# Patient Record
Sex: Male | Born: 1980 | Race: White | Hispanic: No | Marital: Single | Smoking: Light tobacco smoker
Health system: Southern US, Community
[De-identification: ages and names within clinical notes are randomized; demographics above are authoritative.]

---

## 2005-02-13 ENCOUNTER — Encounter (INDEPENDENT_AMBULATORY_CARE_PROVIDER_SITE_OTHER): Payer: Self-pay | Admitting: Specialist

## 2005-02-13 ENCOUNTER — Ambulatory Visit (HOSPITAL_COMMUNITY): Admission: RE | Admit: 2005-02-13 | Discharge: 2005-02-13 | Payer: Self-pay | Admitting: General Surgery

## 2005-02-13 ENCOUNTER — Encounter (INDEPENDENT_AMBULATORY_CARE_PROVIDER_SITE_OTHER): Payer: Self-pay | Admitting: *Deleted

## 2005-02-19 ENCOUNTER — Ambulatory Visit (HOSPITAL_COMMUNITY): Admission: RE | Admit: 2005-02-19 | Discharge: 2005-02-19 | Payer: Self-pay | Admitting: General Surgery

## 2005-02-19 ENCOUNTER — Encounter (INDEPENDENT_AMBULATORY_CARE_PROVIDER_SITE_OTHER): Payer: Self-pay | Admitting: *Deleted

## 2010-10-31 ENCOUNTER — Emergency Department (HOSPITAL_COMMUNITY): Admission: EM | Admit: 2010-10-31 | Discharge: 2010-10-31 | Payer: Self-pay | Admitting: Emergency Medicine

## 2011-05-10 NOTE — Op Note (Signed)
[name], [name]                 ACCOUNT NO.:  [id number]   MEDICAL RECORD NO.:  [id number]          PATIENT TYPE:  AMB   LOCATION:  DAY                          FACILITY:  [facility]   PHYSICIAN:  Timothy E. Earlene Plater, M.D. DATE OF BIRTH:  10-08-1981   DATE OF PROCEDURE:  DATE OF DISCHARGE:                                 OPERATIVE REPORT   PREOPERATIVE DIAGNOSES:  Left axillary adenopathy.   POSTOPERATIVE DIAGNOSES:  Left axillary adenopathy.   PROCEDURE:  Left axillary dissection.   SURGEON:  Timothy E. Earlene Plater, M.D.   ANESTHESIA:  General.   Mr. Bill Levy is 24 and was referred to me for axillary adenopathy after several  rounds of antibiotics. He was observed as he was otherwise asymptomatic with  thoughts that this was cat scratch fever and lymph node seemed to get worse.  He was sent for ultrasound and core biopsy which on return necrotic tissue.  Because of the longstanding adenopathy, he wishes to proceed with a complete  node removal as has been carefully explained. The suspicion is cat scratch  fever and he does live in a house with a cat. He otherwise seems healthy and  has no other health or risk factors that are known. He was seen, identified,  the left shoulder marked and the permit signed.   He was taken to the operating room, placed supine, general endotracheal  anesthesia administered. The left arm was outstretched and a pad was placed  beneath the left chest and shoulder. The left axilla was shaved, prepped and  draped. Marcaine 0.25% with epinephrine was used in the skin and  subcutaneous tissue. A curvilinear incision made over the visible palpable  mass of the left axilla. A necrotic cavity with a thick yellow exudate was  encountered just beneath the skin. This seemed to be probably an old lymph  node with a necrotic center. This was dissected out sharply and bluntly from  the surrounding tissue, attached to it were several other more normal  appearing lymph nodes,   these were also submitted. Cultures both aerobic and  anaerobic were made of the necrotic center. Bleeding was cauterized and/or  clipped. No other major structures were identified though the pectoralis  major and minor, the chest wall and latissimus dorsi could all be seen. A  long thoracic curve was seen and avoided. The wound was dry. I did place a  10 mm drain and brought it out through an inferior stab wound tied to the  skin and applied and suctioned. The wound was closed in layers with  intentionally leaving the mid portion of the wound open where the necrotic  crater had been. Steri-Strips applied, dry sterile dressing. He was awakened  and taken recovery room in good condition. He will be seen and followed as  an outpatient.      TED/MEDQ  D:  02/19/2005  T:  02/19/2005  Job:  [id number]

## 2014-02-10 ENCOUNTER — Encounter (HOSPITAL_COMMUNITY): Payer: Self-pay | Admitting: Emergency Medicine

## 2014-02-10 ENCOUNTER — Emergency Department (HOSPITAL_COMMUNITY)
Admission: EM | Admit: 2014-02-10 | Discharge: 2014-02-10 | Disposition: A | Discharge status: Home / Self Care | Payer: BC Managed Care – PPO | Attending: Emergency Medicine | Admitting: Emergency Medicine

## 2014-02-10 DIAGNOSIS — K0889 Other specified disorders of teeth and supporting structures: Secondary | ICD-10-CM

## 2014-02-10 DIAGNOSIS — F17200 Nicotine dependence, unspecified, uncomplicated: Secondary | ICD-10-CM | POA: Insufficient documentation

## 2014-02-10 DIAGNOSIS — K089 Disorder of teeth and supporting structures, unspecified: Secondary | ICD-10-CM | POA: Insufficient documentation

## 2014-02-10 MED ORDER — HYDROCODONE-ACETAMINOPHEN 5-325 MG PO TABS: 1.0000 | ORAL_TABLET | Freq: Four times a day (QID) | ORAL | Status: DC | PRN

## 2014-02-10 MED ORDER — PENICILLIN V POTASSIUM 500 MG PO TABS: 500.0000 mg | ORAL_TABLET | Freq: Three times a day (TID) | ORAL | Status: AC

## 2014-02-10 MED ORDER — HYDROCODONE-ACETAMINOPHEN 5-325 MG PO TABS
2.0000 | ORAL_TABLET | Freq: Once | ORAL | Status: AC
  Administered 2014-02-10: 2 via ORAL
  Filled 2014-02-10: qty 2

## 2014-02-10 NOTE — ED Provider Notes (Signed)
CSN: [id number]     Arrival date & time 02/10/14  1739 History  This chart was scribed for non-physician practitioner Roxy Horsemanobert Avika Carbine, PA-C working with Gavin PoundMichael Y. Oletta LamasGhim, MD by Joaquin MusicKristina Sanchez-Matthews, ED Scribe. This patient was seen in room TR07C/TR07C and the patient's care was started at 6:02 PM .   Chief Complaint  Patient presents with  . Dental Pain   The history is provided by the patient. No language interpreter was used.   HPI Comments: Bill Levy is a 33 y.o. male who presents to the Emergency Department complaining of ongoing R upper dental pain that began 4 days ago . Pt states his pain worsened 3 days ago and reports not being able to sleep due to dental pain. He reports having 6/10 pain that "can vary from time to time" per patient. He describes the pain as throbbing and dull. Pt states he has been taking OTC Ibuprofen, Alieve, and Tylenol without relief. He reports taking Alieve frequently for SI joint pain. Pt states he has not been to his dentist in the past 5 years. He reports having teeth extracted during his last visit to the dentist. Pt denies having any allergies to medications. Pt denies fever, chills and facial swelling.  History reviewed. No pertinent past medical history. History reviewed. No pertinent past surgical history. History reviewed. No pertinent family history. History  Substance Use Topics  . Smoking status: Current Every Day Smoker    Types: Cigarettes  . Smokeless tobacco: Not on file  . Alcohol Use: No    Review of Systems  Constitutional: Negative for fever and chills.  HENT: Positive for dental problem. Negative for facial swelling.     Allergies  Review of patient's allergies indicates no known allergies.  Home Medications  No current outpatient prescriptions on file. BP 155/93  Pulse 98  Temp(Src) 97.8 F (36.6 C) (Oral)  Resp 16  SpO2 98%  Physical Exam  Nursing note and vitals reviewed. Constitutional: He is oriented to  person, place, and time. He appears well-developed and well-nourished. No distress.  HENT:  Head: Normocephalic and atraumatic.  Mouth/Throat:    Poor dentition throughout.  Affected tooth as diagrammed.  No signs of peritonsillar or tonsillar abscess.  No signs of gingival abscess. Oropharynx is clear and without exudates.  Uvula is midline.  Airway is intact. No signs of Ludwig's angina with palpation of oral and sublingual mucosa.   Eyes: EOM are normal.  Neck: Neck supple. No tracheal deviation present.  Cardiovascular: Normal rate.   Pulmonary/Chest: Effort normal. No respiratory distress.  Musculoskeletal: Normal range of motion.  Neurological: He is alert and oriented to person, place, and time.  Skin: Skin is warm and dry.  Psychiatric: He has a normal mood and affect. His behavior is normal.   ED Course  Procedures  DIAGNOSTIC STUDIES: Oxygen Saturation is 98% on RA, normal by my interpretation.    COORDINATION OF CARE: 6:04 PM-Discussed treatment plan which includes discharge pt with Vicodin and Penicillin. Will administer Vicodin while in ED. Advised pt to F/U with dentist. Pt agreed to plan.   Labs Review Labs Reviewed - No data to display Imaging Review No results found.  EKG Interpretation   None      MDM   Final diagnoses:  Pain, dental    Patient with toothache.  No gross abscess.  Exam unconcerning for Ludwig's angina or spread of infection.  Will treat with penicillin and pain medicine.  Urged patient to follow-up  with dentist.     I personally performed the services described in this documentation, which was scribed in my presence. The recorded information has been reviewed and is accurate.     Roxy Horseman, PA-C 02/10/14 1810

## 2014-02-10 NOTE — Discharge Instructions (Signed)

## 2014-02-10 NOTE — ED Notes (Signed)
Pt reports right side upper dental pain x 2 days. Airway intact.

## 2014-02-18 NOTE — ED Provider Notes (Signed)
Medical screening examination/treatment/procedure(s) were performed by non-physician practitioner and as supervising physician I was immediately available for consultation/collaboration.  EKG Interpretation  None    Demara Lover Y. Zerina Hallinan, MD 02/18/14 1033 

## 2018-02-11 ENCOUNTER — Encounter (HOSPITAL_COMMUNITY): Payer: Self-pay

## 2018-02-11 ENCOUNTER — Emergency Department (HOSPITAL_COMMUNITY): Payer: BLUE CROSS/BLUE SHIELD

## 2018-02-11 ENCOUNTER — Emergency Department (HOSPITAL_COMMUNITY)
Admission: EM | Admit: 2018-02-11 | Discharge: 2018-02-11 | Disposition: A | Discharge status: Home / Self Care | Payer: BLUE CROSS/BLUE SHIELD | Attending: Emergency Medicine | Admitting: Emergency Medicine

## 2018-02-11 ENCOUNTER — Other Ambulatory Visit: Payer: Self-pay

## 2018-02-11 DIAGNOSIS — F17200 Nicotine dependence, unspecified, uncomplicated: Secondary | ICD-10-CM | POA: Diagnosis not present

## 2018-02-11 DIAGNOSIS — M25552 Pain in left hip: Secondary | ICD-10-CM | POA: Diagnosis present

## 2018-02-11 MED ORDER — MELOXICAM 15 MG PO TABS: 15.0000 mg | ORAL_TABLET | Freq: Every day | ORAL | 0 refills | Status: AC

## 2018-02-11 MED ORDER — HYDROCODONE-ACETAMINOPHEN 5-325 MG PO TABS
2.0000 | ORAL_TABLET | Freq: Once | ORAL | Status: AC
  Administered 2018-02-11: 2 via ORAL
  Filled 2018-02-11: qty 2

## 2018-02-11 MED ORDER — IBUPROFEN 800 MG PO TABS
800.0000 mg | ORAL_TABLET | Freq: Once | ORAL | Status: AC
  Administered 2018-02-11: 800 mg via ORAL
  Filled 2018-02-11: qty 1

## 2018-02-11 MED ORDER — PREDNISONE 20 MG PO TABS
60.0000 mg | ORAL_TABLET | Freq: Once | ORAL | Status: AC
Start: 2018-02-11 — End: 2018-02-11
  Administered 2018-02-11: 60 mg via ORAL
  Filled 2018-02-11: qty 3

## 2018-02-11 MED ORDER — PREDNISONE 20 MG PO TABS: ORAL_TABLET | ORAL | 0 refills | Status: AC

## 2018-02-11 MED ORDER — HYDROCODONE-ACETAMINOPHEN 5-325 MG PO TABS: 1.0000 | ORAL_TABLET | Freq: Four times a day (QID) | ORAL | 0 refills | Status: AC | PRN

## 2018-02-11 NOTE — Discharge Instructions (Signed)
Take mobic for pain   Take vicodin for severe pain. DO NOT drive with it.   Take prednisone as prescribed.   See [facility] for follow up   Return to ER if you have worse hip pain, trouble walking, weakness, numbness

## 2018-02-11 NOTE — ED Triage Notes (Signed)
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 Pt a&ox 4 on arrival.-Monique,RN

## 2018-02-11 NOTE — ED Provider Notes (Signed)
[facility] EMERGENCY DEPARTMENT Provider Note   CSN: [id number] Arrival date & time: 02/11/18  5784     History   Chief Complaint Chief Complaint  Patient presents with  . Hip Pain    HPI ZACKARI RUANE is a 37 y.o. male hx of previous SI joint pain here with L hip pain for 2-3 days. Progressively worsening L hip pain, denies trauma or injury. Denies radiation to the pain. Denies fevers. Patient states that it is worse with walking. Had previous SI joint pain and saw [facility] and had cortisone shot that helped with pain. Denies any numbness or weakness.   The history is provided by the patient.    History reviewed. No pertinent past medical history.  There are no active problems to display for this patient.   History reviewed. No pertinent surgical history.     Home Medications    Prior to Admission medications   Medication Sig Start Date End Date Taking? Authorizing Provider  acetaminophen (TYLENOL) 325 MG tablet Take 650 mg by mouth every 6 (six) hours as needed for moderate pain (toothpain).    [provider]  HYDROcodone-acetaminophen (NORCO/VICODIN) 5-325 MG per tablet Take 1-2 tablets by mouth every 6 (six) hours as needed. 02/10/14   Roxy Horseman, PA-C  naproxen sodium (ANAPROX) 220 MG tablet Take 220 mg by mouth 2 (two) times daily with a meal.    [provider]  penicillin v potassium (VEETID) 500 MG tablet Take 1 tablet (500 mg total) by mouth 3 (three) times daily. 02/10/14   Roxy Horseman, PA-C    Family History History reviewed. No pertinent family history.  Social History Social History   Tobacco Use  . Smoking status: Light Tobacco Smoker    Types: Cigarettes  Substance Use Topics  . Alcohol use: No  . Drug use: No     Allergies   Patient has no known allergies.   Review of Systems Review of Systems  Musculoskeletal:       L hip pain   All other systems reviewed and are  negative.    Physical Exam Updated Vital Signs BP 130/78 (BP Location: Left Arm)   Pulse (!) 102   Temp 97.6 F (36.4 C) (Oral)   Resp 18   SpO2 96%   Physical Exam  Constitutional: He is oriented to person, place, and time. He appears well-developed.  HENT:  Head: Normocephalic.  Eyes: Conjunctivae and EOM are normal. Pupils are equal, round, and reactive to light.  Neck: Normal range of motion. Neck supple.  Cardiovascular: Normal rate, regular rhythm and normal heart sounds.  Pulmonary/Chest: Effort normal and breath sounds normal. No stridor. No respiratory distress.  Abdominal: Soft. Bowel sounds are normal.  Musculoskeletal:  + L SI joint tenderness, able to range L hip and no obvious hip deformity. No saddle anesthesia. Nl strength and sensation bilateral lower extremities   Neurological: He is alert and oriented to person, place, and time.  Skin: Skin is warm.  Psychiatric: He has a normal mood and affect.  Nursing note and vitals reviewed.    ED Treatments / Results  Labs (all labs ordered are listed, but only abnormal results are displayed) Labs Reviewed - No data to display  EKG  EKG Interpretation None       Radiology Dg Hip Unilat With Pelvis 2-3 Views Left  Result Date: 02/11/2018 CLINICAL DATA:  Left hip pain EXAM: DG HIP (WITH OR WITHOUT PELVIS) 2-3V LEFT COMPARISON:  None. FINDINGS: There is no evidence of hip fracture or dislocation. There is no evidence of arthropathy or other focal bone abnormality. IMPRESSION: Negative. Electronically Signed   By: Marlan Palauharles  Clark M.D.   On: 02/11/2018 07:51    Procedures Procedures (including critical care time)  Medications Ordered in ED Medications  predniSONE (DELTASONE) tablet 60 mg (not administered)  HYDROcodone-acetaminophen (NORCO/VICODIN) 5-325 MG per tablet 2 tablet (not administered)  ibuprofen (ADVIL,MOTRIN) tablet 800 mg (not administered)     Initial Impression / Assessment and Plan / ED  Course  I have reviewed the triage vital signs and the nursing notes.  Pertinent labs & imaging results that were available during my care of the patient were reviewed by me and considered in my medical decision making (see chart for details).     Renette ButtersChase A Rindfleisch is a 37 y.o. male here with L hip pain. Likely SI joint pain. Neurovascular intact in the lower extremities. Xray showed no fracture. Will start a course of PO steroids, vicodin for pain, mobic as well. [facility] injection if symptoms not improved.    Final Clinical Impressions(s) / ED Diagnoses   Final diagnoses:  None    ED Discharge Orders    None       Charlynne PanderYao, Aimi Essner Hsienta, MD 02/11/18 [phone]

## 2018-04-09 ENCOUNTER — Emergency Department (HOSPITAL_BASED_OUTPATIENT_CLINIC_OR_DEPARTMENT_OTHER): Payer: BLUE CROSS/BLUE SHIELD

## 2018-04-09 ENCOUNTER — Other Ambulatory Visit: Payer: Self-pay

## 2018-04-09 ENCOUNTER — Emergency Department (HOSPITAL_BASED_OUTPATIENT_CLINIC_OR_DEPARTMENT_OTHER)
Admission: EM | Admit: 2018-04-09 | Discharge: 2018-04-09 | Disposition: A | Discharge status: Home / Self Care | Payer: BLUE CROSS/BLUE SHIELD | Attending: Emergency Medicine | Admitting: Emergency Medicine

## 2018-04-09 ENCOUNTER — Encounter (HOSPITAL_BASED_OUTPATIENT_CLINIC_OR_DEPARTMENT_OTHER): Payer: Self-pay

## 2018-04-09 DIAGNOSIS — F17290 Nicotine dependence, other tobacco product, uncomplicated: Secondary | ICD-10-CM | POA: Insufficient documentation

## 2018-04-09 DIAGNOSIS — N201 Calculus of ureter: Secondary | ICD-10-CM | POA: Insufficient documentation

## 2018-04-09 DIAGNOSIS — R1084 Generalized abdominal pain: Secondary | ICD-10-CM | POA: Diagnosis present

## 2018-04-09 LAB — COMPREHENSIVE METABOLIC PANEL
ALT: 38 U/L (ref 17–63)
AST: 33 U/L (ref 15–41)
Albumin: 4.4 g/dL (ref 3.5–5.0)
Alkaline Phosphatase: 78 U/L (ref 38–126)
Anion gap: 9 (ref 5–15)
BUN: 13 mg/dL (ref 6–20)
CO2: 27 mmol/L (ref 22–32)
Calcium: 9.8 mg/dL (ref 8.9–10.3)
Chloride: 101 mmol/L (ref 101–111)
Creatinine, Ser: 0.83 mg/dL (ref 0.61–1.24)
GFR calc Af Amer: >60 mL/min (ref >60)
GFR calc non Af Amer: >60 mL/min (ref >60)
Glucose, Bld: 102 mg/dL — ABNORMAL HIGH (ref 65–99)
Potassium: 4.3 mmol/L (ref 3.5–5.1)
Sodium: 137 mmol/L (ref 135–145)
Total Bilirubin: 0.5 mg/dL (ref 0.3–1.2)
Total Protein: 7.6 g/dL (ref 6.5–8.1)

## 2018-04-09 LAB — URINALYSIS, MICROSCOPIC (REFLEX)

## 2018-04-09 LAB — CBC WITH DIFFERENTIAL/PLATELET
Basophils Absolute: 0 10*3/uL (ref 0.0–0.1)
Basophils Relative: 0 %
Eosinophils Absolute: 0.2 10*3/uL (ref 0.0–0.7)
Eosinophils Relative: 2 %
HCT: 46.3 % (ref 39.0–52.0)
Hemoglobin: 16.6 g/dL (ref 13.0–17.0)
Lymphocytes Relative: 21 %
Lymphs Abs: 2.3 10*3/uL (ref 0.7–4.0)
MCH: 32.9 pg (ref 26.0–34.0)
MCHC: 35.9 g/dL (ref 30.0–36.0)
MCV: 91.9 fL (ref 78.0–100.0)
Monocytes Absolute: 1 10*3/uL (ref 0.1–1.0)
Monocytes Relative: 9 %
Neutro Abs: 7.4 10*3/uL (ref 1.7–7.7)
Neutrophils Relative %: 68 %
Platelets: 351 10*3/uL (ref 150–400)
RBC: 5.04 MIL/uL (ref 4.22–5.81)
RDW: 12.3 % (ref 11.5–15.5)
WBC: 10.9 10*3/uL — ABNORMAL HIGH (ref 4.0–10.5)

## 2018-04-09 LAB — URINALYSIS, ROUTINE W REFLEX MICROSCOPIC
Bilirubin Urine: NEGATIVE
Glucose, UA: NEGATIVE mg/dL
KETONES UR: NEGATIVE mg/dL
LEUKOCYTES UA: NEGATIVE
NITRITE: NEGATIVE
PROTEIN: NEGATIVE mg/dL
Specific Gravity, Urine: 1.02 (ref 1.005–1.030)
pH: 6.5 (ref 5.0–8.0)

## 2018-04-09 MED ORDER — SODIUM CHLORIDE 0.9 % IV BOLUS
1000.0000 mL | Freq: Once | INTRAVENOUS | Status: AC
  Administered 2018-04-09: 1000 mL via INTRAVENOUS

## 2018-04-09 MED ORDER — KETOROLAC TROMETHAMINE 30 MG/ML IJ SOLN
30.0000 mg | Freq: Once | INTRAMUSCULAR | Status: AC
  Administered 2018-04-09: 30 mg via INTRAVENOUS
  Filled 2018-04-09: qty 1

## 2018-04-09 MED ORDER — TAMSULOSIN HCL 0.4 MG PO CAPS: 0.4000 mg | ORAL_CAPSULE | Freq: Every day | ORAL | 0 refills | Status: AC

## 2018-04-09 MED ORDER — OXYCODONE-ACETAMINOPHEN 5-325 MG PO TABS: 1.0000 | ORAL_TABLET | ORAL | 0 refills | Status: AC | PRN

## 2018-04-09 NOTE — ED Provider Notes (Signed)
[facility] EMERGENCY DEPARTMENT Provider Note   CSN: [id number] Arrival date & time: 04/09/18  1606     History   Chief Complaint Chief Complaint  Patient presents with  . Flank Pain    HPI Bill Levy is a 37 y.o. male.  HPI Patient presents to the emergency department with right flank pain that started earlier today.  The patient states that the pain got very intense as the day went on.  He states that nothing seemed to make the condition better or worse.  He states he did not take any medications prior to arrival.  The patient denies chest pain, shortness of breath, headache,blurred vision, neck pain, fever, cough, weakness, numbness, dizziness, anorexia, edema,  nausea, vomiting, diarrhea, rash, back pain, dysuria, hematemesis, bloody stool, near syncope, or syncope. History reviewed. No pertinent past medical history.  There are no active problems to display for this patient.   History reviewed. No pertinent surgical history.      Home Medications    Prior to Admission medications   Medication Sig Start Date End Date Taking? Authorizing Provider  acetaminophen (TYLENOL) 325 MG tablet Take 650 mg by mouth every 6 (six) hours as needed for moderate pain (toothpain).   Yes [provider]  HYDROcodone-acetaminophen (NORCO/VICODIN) 5-325 MG tablet Take 1-2 tablets by mouth every 6 (six) hours as needed. 02/11/18  Yes Charlynne Pander, MD  meloxicam (MOBIC) 15 MG tablet Take 1 tablet (15 mg total) by mouth daily. 02/11/18  Yes Charlynne Pander, MD  naproxen sodium (ANAPROX) 220 MG tablet Take 220 mg by mouth 2 (two) times daily with a meal.   Yes [provider]  penicillin v potassium (VEETID) 500 MG tablet Take 1 tablet (500 mg total) by mouth 3 (three) times daily. 02/10/14  Yes Roxy Horseman, PA-C  predniSONE (DELTASONE) 20 MG tablet Take 60 mg daily x 2 days then 40 mg daily x 2 days then 20 mg daily x 2 days 02/11/18  Yes Charlynne Pander, MD    Family History No family history on file.  Social History Social History   Tobacco Use  . Smoking status: Light Tobacco Smoker    Types: E-cigarettes  . Smokeless tobacco: Never Used  Substance Use Topics  . Alcohol use: No  . Drug use: No     Allergies   Patient has no known allergies.   Review of Systems Review of Systems  All other systems negative except as documented in the HPI. All pertinent positives and negatives as reviewed in the HPI. Physical Exam Updated Vital Signs BP 128/84 (BP Location: Right Arm)   Pulse 80   Temp 98.4 F (36.9 C) (Oral)   Resp 18   Ht 5\' 7"  (1.702 m)   Wt 107 kg (236 lb)   SpO2 99%   BMI 36.96 kg/m   Physical Exam  Constitutional: He is oriented to person, place, and time. He appears well-developed and well-nourished. No distress.  HENT:  Head: Normocephalic and atraumatic.  Mouth/Throat: Oropharynx is clear and moist.  Eyes: Pupils are equal, round, and reactive to light.  Neck: Normal range of motion. Neck supple.  Cardiovascular: Normal rate, regular rhythm and normal heart sounds. Exam reveals no gallop and no friction rub.  No murmur heard. Pulmonary/Chest: Effort normal and breath sounds normal. No respiratory distress. He has no wheezes.  Abdominal: Soft. Bowel sounds are normal. He exhibits no distension and no mass. There is no tenderness. There  is no rebound and no guarding.  Neurological: He is alert and oriented to person, place, and time. He exhibits normal muscle tone. Coordination normal.  Skin: Skin is warm and dry. Capillary refill takes less than 2 seconds. No rash noted. No erythema.  Psychiatric: He has a normal mood and affect. His behavior is normal.  Nursing note and vitals reviewed.    ED Treatments / Results  Labs (all labs ordered are listed, but only abnormal results are displayed) Labs Reviewed  URINALYSIS, ROUTINE W REFLEX MICROSCOPIC - Abnormal; Notable for the following  components:      Result Value   APPearance CLOUDY (*)    Hgb urine dipstick MODERATE (*)    All other components within normal limits  URINALYSIS, MICROSCOPIC (REFLEX) - Abnormal; Notable for the following components:   Bacteria, UA FEW (*)    Squamous Epithelial / LPF 0-5 (*)    All other components within normal limits  COMPREHENSIVE METABOLIC PANEL - Abnormal; Notable for the following components:   Glucose, Bld 102 (*)    All other components within normal limits  CBC WITH DIFFERENTIAL/PLATELET - Abnormal; Notable for the following components:   WBC 10.9 (*)    All other components within normal limits    EKG None  Radiology Ct Renal Stone Study  Result Date: 04/09/2018 CLINICAL DATA:  37 year old male with acute RIGHT flank and abdominal pain and hematuria today. EXAM: CT ABDOMEN AND PELVIS WITHOUT CONTRAST TECHNIQUE: Multidetector CT imaging of the abdomen and pelvis was performed following the standard protocol without IV contrast. COMPARISON:  None. FINDINGS: Please note that parenchymal abnormalities may be missed without intravenous contrast. Lower chest: No acute abnormality. Hepatobiliary: The liver and gallbladder are unremarkable. No biliary dilatation. Pancreas: Unremarkable Spleen: Unremarkable Adrenals/Urinary Tract: A 4 mm RIGHT UPJ calculus causes fullness of the RIGHT intrarenal collecting system. A 5 mm nonobstructing LEFT LOWER pole renal calculus is present. The adrenal glands and bladder are unremarkable. Stomach/Bowel: Stomach is within normal limits. Appendix appears normal. No evidence of bowel wall thickening, distention, or inflammatory changes. Vascular/Lymphatic: No significant vascular findings are present. No enlarged abdominal or pelvic lymph nodes. Reproductive: Prostate is unremarkable. Other: No abdominal wall hernia or abnormality. No abdominopelvic ascites. Musculoskeletal: No acute or significant osseous findings. IMPRESSION: 1. 4 mm RIGHT UPJ calculus  causing fullness of the RIGHT intrarenal collecting system. 2. Nonobstructing 5 mm LEFT LOWER pole renal calculus Electronically Signed   By: Harmon Pier M.D.   On: 04/09/2018 18:01    Procedures Procedures (including critical care time)  Medications Ordered in ED Medications  sodium chloride 0.9 % bolus 1,000 mL (1,000 mLs Intravenous New Bag/Given 04/09/18 1852)  ketorolac (TORADOL) 30 MG/ML injection 30 mg (30 mg Intravenous Given 04/09/18 1850)     Initial Impression / Assessment and Plan / ED Course  I have reviewed the triage vital signs and the nursing notes.  Pertinent labs & imaging results that were available during my care of the patient were reviewed by me and considered in my medical decision making (see chart for details).     Patient was having minimal pain at this time.  I have referred him to urology told to follow-up with them as soon as possible.  I told him to return to [facility] for any worsening in his condition.  Patient agrees to the plan and all questions were answered.  I did advise him to hydrate as best as possible.   Final Clinical Impressions(s) /  ED Diagnoses   Final diagnoses:  None    ED Discharge Orders    None       Kyra MangesLawyer, Jenella Craigie, PA-C 04/09/18 1919    Rolland PorterJames, Mark, MD 04/10/18 2037

## 2018-04-09 NOTE — Discharge Instructions (Signed)
Return here as needed.  Follow-up with the doctor provided as soon as possible.  [facility]

## 2018-04-09 NOTE — ED Triage Notes (Signed)
Pt reports left sided flank pain with hematuria.

## 2019-03-12 IMAGING — CT CT RENAL STONE PROTOCOL
2 of 4 series · 17 of 46 positions shown, 19 images · non-contrast
Comparison: None.

CLINICAL DATA: 36-year-old male with acute RIGHT flank and
abdominal pain and hematuria today.

EXAM:
CT ABDOMEN AND PELVIS WITHOUT CONTRAST
TECHNIQUE: Multidetector CT imaging of the abdomen and pelvis was performed
following the standard protocol without IV contrast.

[Series 2: axial st · axial · 0.85mm/px · z∈[+373,+833]mm · 14 of 102 slices shown, 16 images]
[im 5/102  soft-tissue]
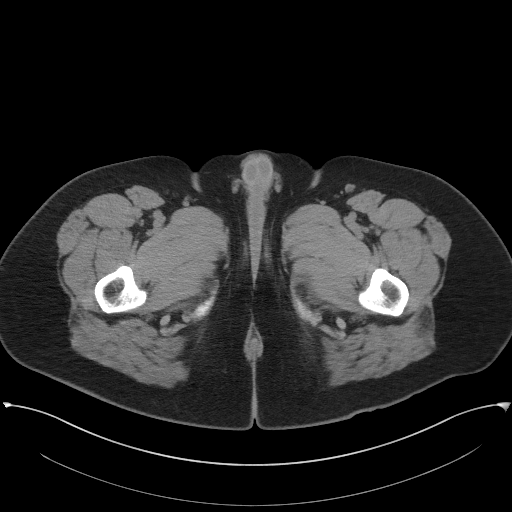
[im 5/102  bone]
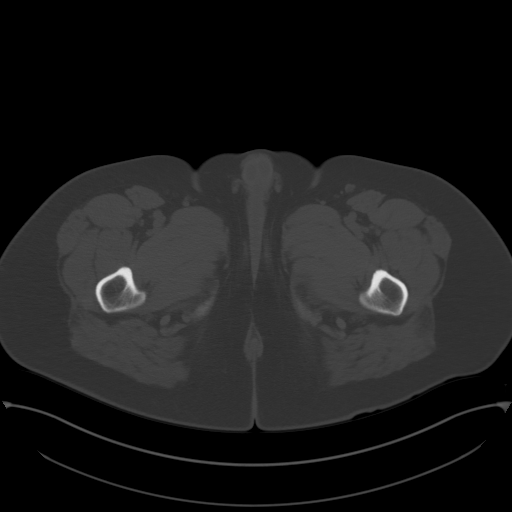
[im 13/102  soft-tissue]
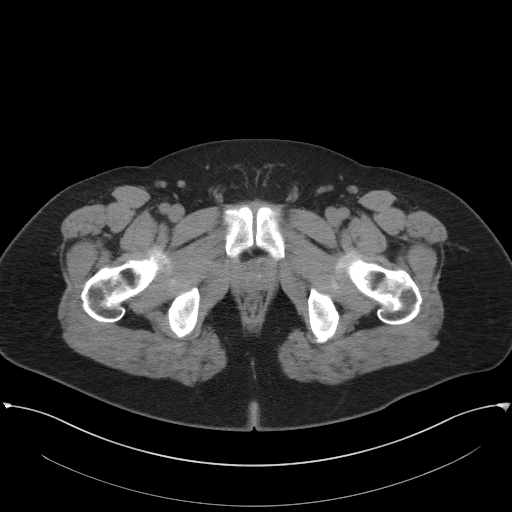
[im 22/102  soft-tissue]
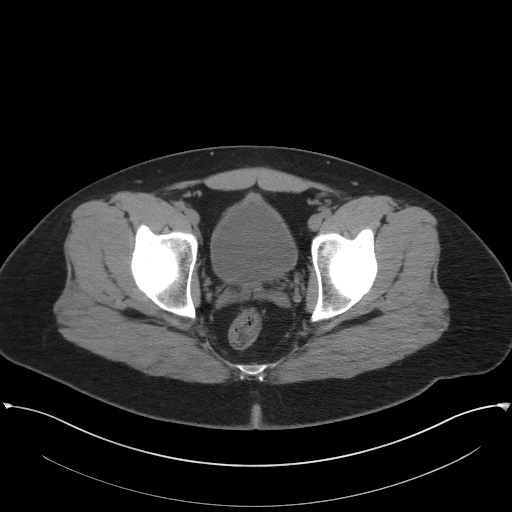
[im 26/102  soft-tissue]
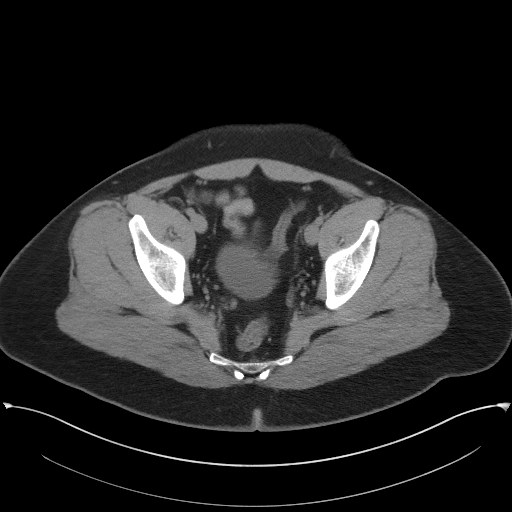
[im 34/102  soft-tissue]
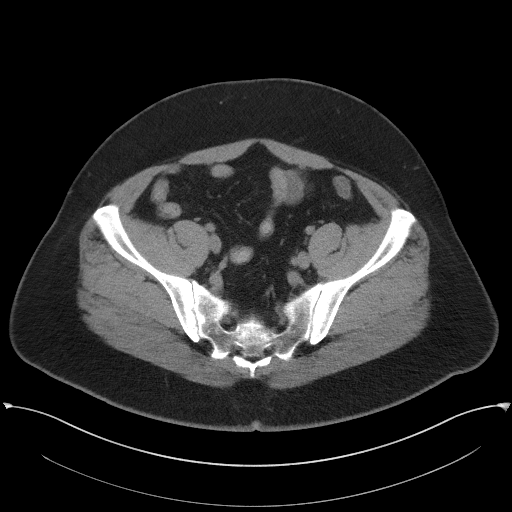
[im 43/102  soft-tissue]
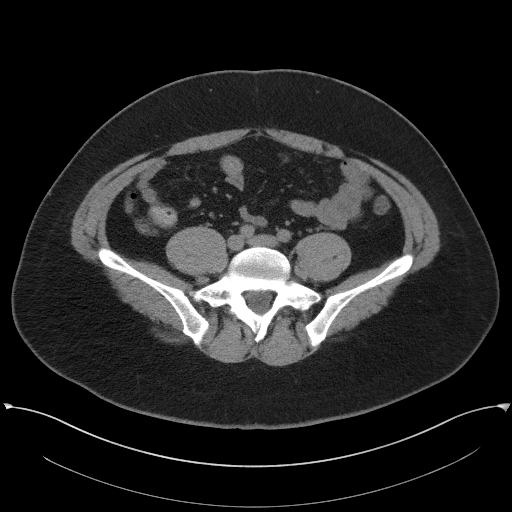
[im 47/102  soft-tissue]
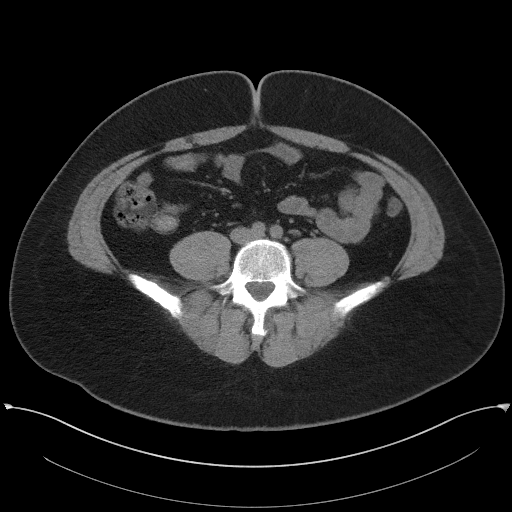
[im 55/102  soft-tissue]
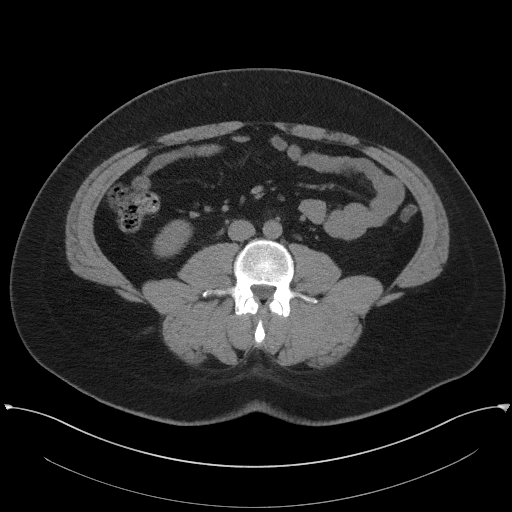
[im 59/102  soft-tissue]
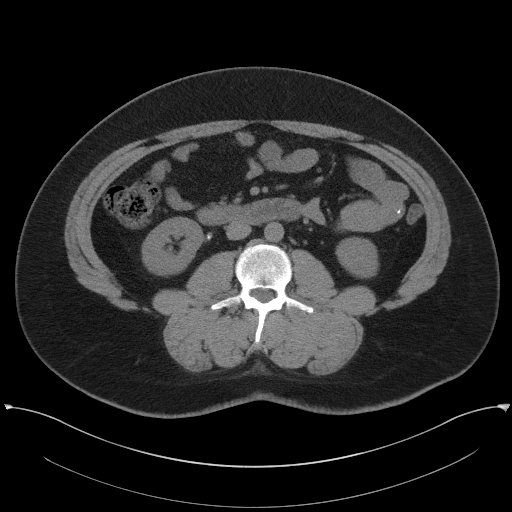
[im 59/102  bone]
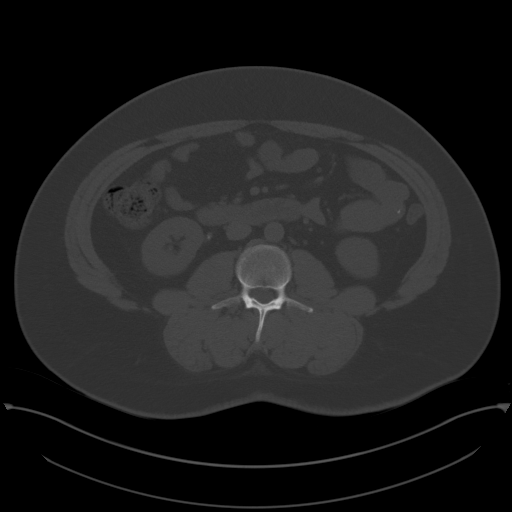
[im 68/102  soft-tissue]
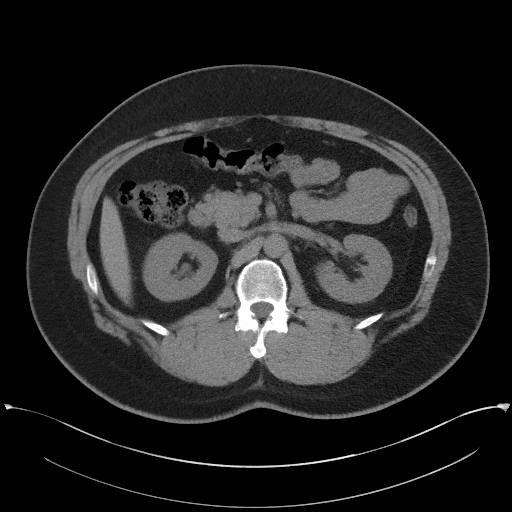
[im 76/102  soft-tissue]
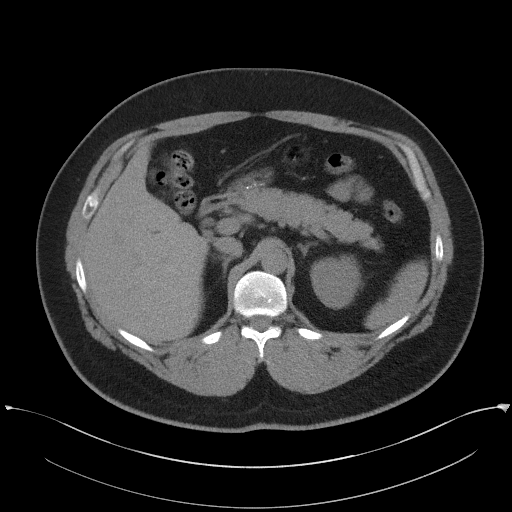
[im 80/102  soft-tissue]
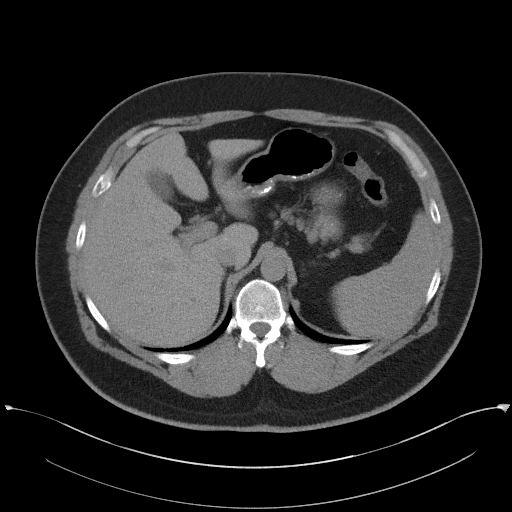
[im 89/102  soft-tissue]
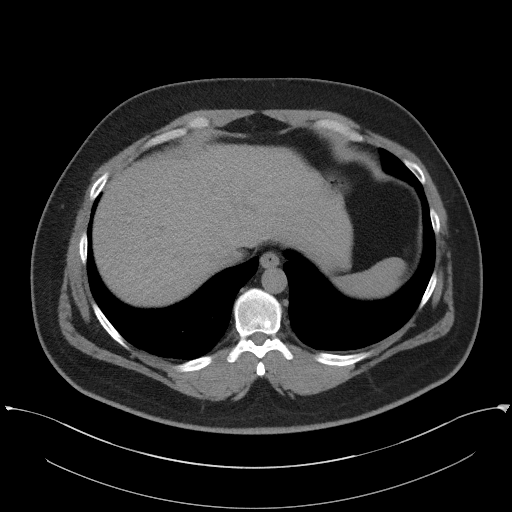
[im 97/102  soft-tissue]
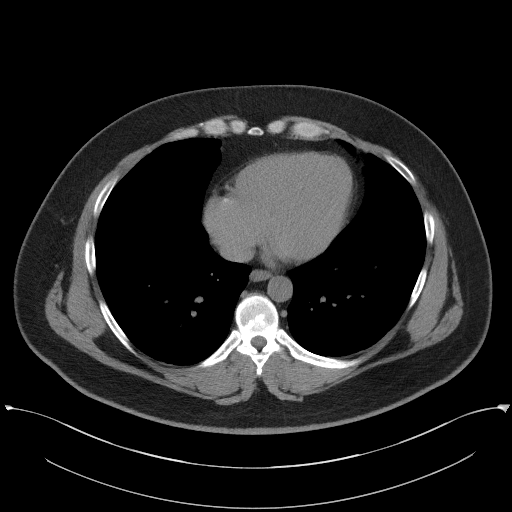

[Series 5: coronal st · coronal · 0.91mm/px · 3 of 102 slices shown]
[im 34/102  soft-tissue]
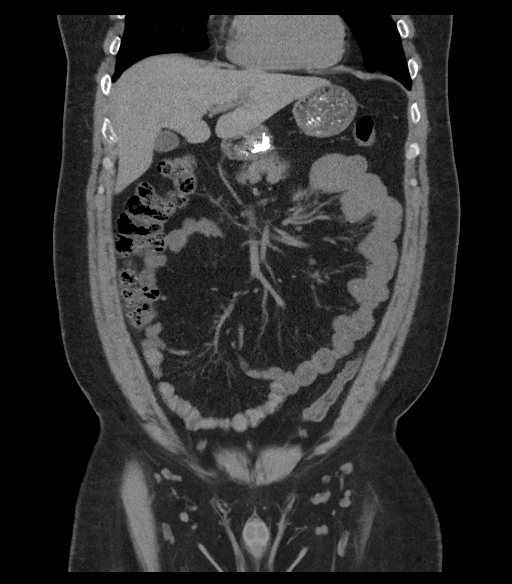
[im 45/102  soft-tissue]
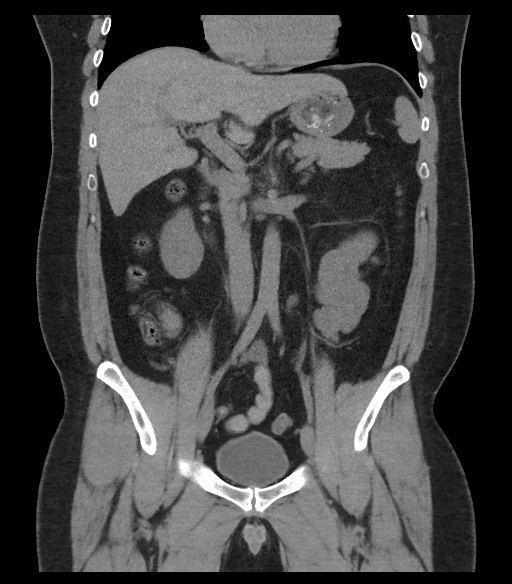
[im 57/102  soft-tissue]
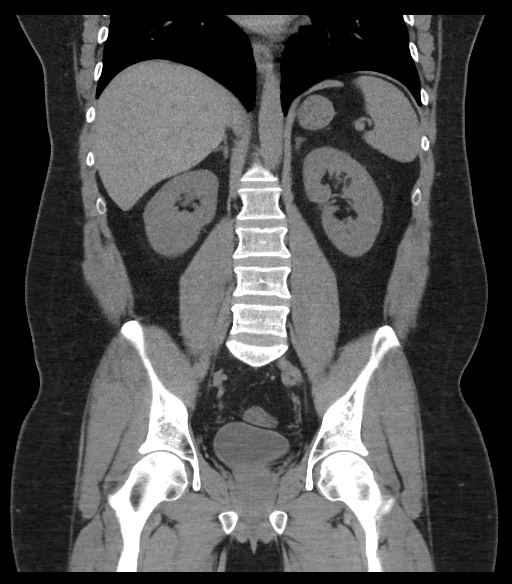

[17 of 46 positions shown; findings below may reference images not displayed]

FINDINGS: Please note that parenchymal abnormalities may be missed without
intravenous contrast.

Lower chest: No acute abnormality.

Hepatobiliary: The liver and gallbladder are unremarkable. No
biliary dilatation.

Pancreas: Unremarkable

Spleen: Unremarkable

Adrenals/Urinary Tract: A 4 mm RIGHT UPJ calculus causes fullness of
the RIGHT intrarenal collecting system. A 5 mm nonobstructing LEFT
LOWER pole renal calculus is present. The adrenal glands and bladder
are unremarkable.

Stomach/Bowel: Stomach is within normal limits. Appendix appears
normal. No evidence of bowel wall thickening, distention, or
inflammatory changes.

Vascular/Lymphatic: No significant vascular findings are present. No
enlarged abdominal or pelvic lymph nodes.

Reproductive: Prostate is unremarkable.

Other: No abdominal wall hernia or abnormality. No abdominopelvic
ascites.

Musculoskeletal: No acute or significant osseous findings.
IMPRESSION: 1. 4 mm RIGHT UPJ calculus causing fullness of the RIGHT intrarenal
collecting system.
2. Nonobstructing 5 mm LEFT LOWER pole renal calculus
